# Patient Record
Sex: Female | Born: 2000 | Race: Black or African American | Hispanic: No | Marital: Single | Smoking: Never smoker
Health system: Southern US, Community
[De-identification: ages and names within clinical notes are randomized; demographics above are authoritative.]

## PROBLEM LIST (undated history)

## (undated) DIAGNOSIS — Z789 Other specified health status: Secondary | ICD-10-CM

## (undated) HISTORY — PX: NO PAST SURGERIES: SHX2092

## (undated) HISTORY — DX: Other specified health status: Z78.9

---
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
## 2023-09-18 ENCOUNTER — Ambulatory Visit: Payer: Medicaid Other | Admitting: Obstetrics and Gynecology

## 2023-11-25 ENCOUNTER — Ambulatory Visit: Payer: Medicaid Other | Admitting: Obstetrics and Gynecology

## 2023-11-25 ENCOUNTER — Other Ambulatory Visit (HOSPITAL_COMMUNITY)
Admission: RE | Admit: 2023-11-25 | Discharge: 2023-11-25 | Disposition: A | Discharge status: Home / Self Care | Payer: Medicaid Other | Source: Ambulatory Visit | Attending: Obstetrics and Gynecology | Admitting: Obstetrics and Gynecology

## 2023-11-25 ENCOUNTER — Encounter: Payer: Self-pay | Admitting: Obstetrics and Gynecology

## 2023-11-25 VITALS — BP 110/77 | HR 67 | Ht 61.0 in | Wt 199.0 lb

## 2023-11-25 DIAGNOSIS — Z3200 Encounter for pregnancy test, result unknown: Secondary | ICD-10-CM | POA: Diagnosis not present

## 2023-11-25 DIAGNOSIS — E66812 Obesity, class 2: Secondary | ICD-10-CM

## 2023-11-25 DIAGNOSIS — Z01419 Encounter for gynecological examination (general) (routine) without abnormal findings: Secondary | ICD-10-CM

## 2023-11-25 DIAGNOSIS — E282 Polycystic ovarian syndrome: Secondary | ICD-10-CM

## 2023-11-25 DIAGNOSIS — Z1151 Encounter for screening for human papillomavirus (HPV): Secondary | ICD-10-CM | POA: Diagnosis not present

## 2023-11-25 DIAGNOSIS — N915 Oligomenorrhea, unspecified: Secondary | ICD-10-CM | POA: Diagnosis not present

## 2023-11-25 LAB — POCT URINE PREGNANCY: Preg Test, Ur: NEGATIVE

## 2023-11-25 NOTE — Progress Notes (Signed)
Subjective:     Anita Haynes is a 22 y.o. female P0 with LMP 07/25/23 and BMI 37 is here for a comprehensive physical exam. The patient reports a history of irregular menses since 2019. She states she often has 2-3 period per year. She denies pelvic pain or abnormal discharge. She is sexually active without contraception and desires a pregnancy. She states that her partner is healthy and has had a normal semen analysis with his PCP. Patient is without any other complaints  Past Medical History:  Diagnosis Date   Medical history non-contributory    Past Surgical History:  Procedure Laterality Date   NO PAST SURGERIES     Family History  Problem Relation Age of Onset   Healthy Mother    Social History   Socioeconomic History   Marital status: Single    Spouse name: Not on file   Number of children: 0   Years of education: Not on file   Highest education level: Not on file  Occupational History   Not on file  Tobacco Use   Smoking status: Never   Smokeless tobacco: Never  Vaping Use   Vaping status: Never Used  Substance and Sexual Activity   Alcohol use: Not Currently   Drug use: Never   Sexual activity: Yes    Birth control/protection: None  Other Topics Concern   Not on file  Social History Narrative   Not on file   Social Drivers of Health   Financial Resource Strain: Not on file  Food Insecurity: Not on file  Transportation Needs: Not on file  Physical Activity: Not on file  Stress: Not on file  Social Connections: Not on file  Intimate Partner Violence: Not on file   Health Maintenance  Topic Date Due   HPV VACCINES (1 - 3-dose series) Never done   CHLAMYDIA SCREENING  11/06/2019   DTaP/Tdap/Td (1 - Tdap) Never done   INFLUENZA VACCINE  07/11/2023   COVID-19 Vaccine (1 - 2024-25 season) Never done   Cervical Cancer Screening (Pap smear)  Never done   Hepatitis C Screening  Completed   HIV Screening  Completed       Review of Systems Pertinent  items noted in HPI and remainder of comprehensive ROS otherwise negative.   Objective:  Blood pressure 110/77, pulse 67, height 5\' 1"  (1.549 m), weight 199 lb (90.3 kg), last menstrual period 07/25/2023.   GENERAL: Well-developed, well-nourished female in no acute distress.  HEENT: Normocephalic, atraumatic. Sclerae anicteric.  NECK: Supple. Normal thyroid.  LUNGS: Clear to auscultation bilaterally.  HEART: Regular rate and rhythm. BREASTS: Symmetric in size. No palpable masses or lymphadenopathy, skin changes, or nipple drainage. ABDOMEN: Soft, nontender, nondistended. No organomegaly. PELVIC: Normal external female genitalia. Vagina is pink and rugated.  Normal discharge. Normal appearing cervix. Uterus is normal in size. No adnexal mass or tenderness. Chaperone present during the pelvic exam EXTREMITIES: No cyanosis, clubbing, or edema, 2+ distal pulses.     Assessment:    Healthy female exam.      Plan:    Pap smear collected STI screening collected TSH and labs ordered Patient will be contacted with abnormal results Patient with long history of oligomenorrhea with normal blood work- suspect PCOS.  Patient agreed to nutritionist referral Patient declined contraception for cycle control as she is trying to conceive Discussed the use of ovulation predictor kits while attempting to lose weight Advised to start taking prenatal vitamins See After Visit Summary for Counseling Recommendations

## 2023-11-25 NOTE — Progress Notes (Addendum)
22 y.o. New GYN presents for AEX/PAP/STD screening.  Pt c/o irregular periods, her last period was in August 2024 and before that it came in March.  UPT Negative.

## 2023-11-25 NOTE — Patient Instructions (Signed)

## 2023-11-26 LAB — CERVICOVAGINAL ANCILLARY ONLY
Chlamydia: NEGATIVE
Comment: NEGATIVE
Comment: NORMAL
Neisseria Gonorrhea: NEGATIVE

## 2023-11-26 LAB — CYTOLOGY - PAP
Comment: NEGATIVE
Diagnosis: NEGATIVE
High risk HPV: NEGATIVE

## 2023-11-28 LAB — TSH+PRL+FSH+TESTT+LH+DHEA S...
17-Hydroxyprogesterone: 137 ng/dL
Androstenedione: 240 ng/dL (ref 41–262)
DHEA-SO4: 302 ug/dL (ref 110.0–431.7)
FSH: 6.5 m[IU]/mL
LH: 17.7 m[IU]/mL
Prolactin: 23.7 ng/mL (ref 4.8–33.4)
TSH: 1.95 u[IU]/mL (ref 0.450–4.500)
Testosterone, Free: 3.3 pg/mL (ref 0.0–4.2)
Testosterone: 79 ng/dL — ABNORMAL HIGH (ref 13–71)

## 2023-11-28 LAB — RPR: RPR Ser Ql: NONREACTIVE

## 2023-11-28 LAB — HEMOGLOBIN A1C
Est. average glucose Bld gHb Est-mCnc: 114 mg/dL
Hgb A1c MFr Bld: 5.6 % (ref 4.8–5.6)

## 2023-11-28 LAB — HIV ANTIBODY (ROUTINE TESTING W REFLEX): HIV Screen 4th Generation wRfx: NONREACTIVE

## 2023-11-28 LAB — HEPATITIS B SURFACE ANTIGEN: Hepatitis B Surface Ag: NEGATIVE

## 2023-11-28 LAB — HEPATITIS C ANTIBODY: Hep C Virus Ab: NONREACTIVE

## 2024-01-17 ENCOUNTER — Ambulatory Visit: Payer: Medicaid Other | Admitting: Dietician

## 2024-02-03 ENCOUNTER — Ambulatory Visit (HOSPITAL_COMMUNITY)
Admission: EM | Admit: 2024-02-03 | Discharge: 2024-02-03 | Disposition: A | Discharge status: Home / Self Care | Payer: Medicaid Other | Attending: Emergency Medicine | Admitting: Emergency Medicine

## 2024-02-03 ENCOUNTER — Encounter (HOSPITAL_COMMUNITY): Payer: Self-pay | Admitting: Emergency Medicine

## 2024-02-03 DIAGNOSIS — U071 COVID-19: Secondary | ICD-10-CM | POA: Diagnosis not present

## 2024-02-03 LAB — POC COVID19/FLU A&B COMBO
Covid Antigen, POC: POSITIVE — AB
Influenza A Antigen, POC: NEGATIVE
Influenza B Antigen, POC: NEGATIVE

## 2024-02-03 NOTE — ED Triage Notes (Signed)
 Used interpretor  Pt having back pain yesterday that is now all over body. Pt also c/o fatigue and headache.  Hasn't taken any medications for pain.

## 2024-02-03 NOTE — Discharge Instructions (Addendum)
 You tested positive for COVID-19 today. You can alternate between Tylenol and Ibuprofen as needed for pain and fever. I recommend Mucinex, Delsym, or Robitussin for cough and congestion as needed. Stay hydrated and get plenty of rest. Return here if symptoms persist or worsen.   Wapimishije ibyiza kuri COVID-19 uyumunsi. Anita Haynes guhinduranya hagati ya Tylenol na Ibuprofen nkuko bikenewe kubabara no kugira umuriro. Ndasaba Mucinex, Delsym, cyangwa Robitussin gukorora no kuzura nkuko bikenewe. Gumana amazi kandi uruhuke byinshi. Garuka hano niba ibimenyetso bikomeje cyangwa bikabije.

## 2024-02-03 NOTE — ED Provider Notes (Signed)
 [facility]    CSN: [id number] Arrival date & time: 02/03/24  1316      History   Chief Complaint Chief Complaint  Patient presents with   Generalized Body Aches    HPI Anita Haynes is a 23 y.o. female.   Patient presents with body aches, headache, sore throat, fatigue, and chills that began yesterday per interpreter. Denies fever, cough, congestion, nausea, vomiting, and diarrhea. Denies taking anything for symptoms.      Past Medical History:  Diagnosis Date   Medical history non-contributory     Patient Active Problem List   Diagnosis Date Noted   Language barrier 10/31/2018   High risk sexual behavior in adolescent 10/31/2018   Pain of upper abdomen 10/31/2018   Oligomenorrhea 10/31/2018    Past Surgical History:  Procedure Laterality Date   NO PAST SURGERIES      OB History     Gravida  0   Para  0   Term  0   Preterm  0   AB  0   Living  0      SAB  0   IAB  0   Ectopic  0   Multiple  0   Live Births  0            Home Medications    Prior to Admission medications   Not on File    Family History Family History  Problem Relation Age of Onset   Healthy Mother     Social History Social History   Tobacco Use   Smoking status: Never   Smokeless tobacco: Never  Vaping Use   Vaping status: Never Used  Substance Use Topics   Alcohol use: Not Currently   Drug use: Never     Allergies   Patient has no known allergies.   Review of Systems Review of Systems  Per HPI  Physical Exam Triage Vital Signs ED Triage Vitals  Encounter Vitals Group     BP 02/03/24 1438 115/84     Systolic BP Percentile --      Diastolic BP Percentile --      Pulse Rate 02/03/24 1438 97     Resp 02/03/24 1438 17     Temp 02/03/24 1438 98.4 F (36.9 C)     Temp Source 02/03/24 1438 Oral     SpO2 02/03/24 1438 96 %     Weight --      Height --      Head Circumference --      Peak Flow --      Pain Score 02/03/24  1440 6     Pain Loc --      Pain Education --      Exclude from Growth Chart --    No data found.  Updated Vital Signs BP 115/84 (BP Location: Left Arm)   Pulse 97   Temp 98.4 F (36.9 C) (Oral)   Resp 17   LMP 12/28/2023   SpO2 96%   Visual Acuity Right Eye Distance:   Left Eye Distance:   Bilateral Distance:    Right Eye Near:   Left Eye Near:    Bilateral Near:     Physical Exam Vitals and nursing note reviewed.  Constitutional:      General: She is awake. She is not in acute distress.    Appearance: Normal appearance. She is well-developed and well-groomed. She is not ill-appearing.  HENT:     Right Ear: Tympanic  membrane, ear canal and external ear normal.     Left Ear: Tympanic membrane, ear canal and external ear normal.     Nose: Nose normal.     Mouth/Throat:     Mouth: Mucous membranes are moist.     Pharynx: Posterior oropharyngeal erythema present. No oropharyngeal exudate.  Cardiovascular:     Rate and Rhythm: Normal rate and regular rhythm.  Pulmonary:     Effort: Pulmonary effort is normal.     Breath sounds: Normal breath sounds.  Musculoskeletal:        General: Normal range of motion.  Skin:    General: Skin is warm and dry.  Neurological:     Mental Status: She is alert.  Psychiatric:        Behavior: Behavior is cooperative.      UC Treatments / Results  Labs (all labs ordered are listed, but only abnormal results are displayed) Labs Reviewed  POC COVID19/FLU A&B COMBO - Abnormal; Notable for the following components:      Result Value   Covid Antigen, POC Positive (*)    All other components within normal limits    EKG   Radiology No results found.  Procedures Procedures (including critical care time)  Medications Ordered in UC Medications - No data to display  Initial Impression / Assessment and Plan / UC Course  I have reviewed the triage vital signs and the nursing notes.  Pertinent labs & imaging results that were  available during my care of the patient were reviewed by me and considered in my medical decision making (see chart for details).     Per interpreter, patient presented with body aches, headache, sore throat, fatigue, and chills that began yesterday.   Upon assessment mild erythema noted to pharynx. Lungs clear bilaterally on auscultation. No other significant findings on exam.   Tested positive for COVID. Discussed OTC medication for symptoms. Discussed return precautions.  Final Clinical Impressions(s) / UC Diagnoses   Final diagnoses:  COVID-19     Discharge Instructions      You tested positive for COVID-19 today. You can alternate between Tylenol and Ibuprofen as needed for pain and fever. I recommend Mucinex, Delsym, or Robitussin for cough and congestion as needed. Stay hydrated and get plenty of rest. Return here if symptoms persist or worsen.   Wapimishije ibyiza kuri COVID-19 uyumunsi. Edgar Frisk guhinduranya hagati ya Tylenol na Ibuprofen nkuko bikenewe kubabara no kugira umuriro. Ndasaba Mucinex, Delsym, cyangwa Robitussin gukorora no kuzura nkuko bikenewe. Gumana amazi kandi uruhuke byinshi. Garuka hano niba ibimenyetso bikomeje cyangwa bikabije.     ED Prescriptions   None    PDMP not reviewed this encounter.   Wynonia Lawman A, NP 02/03/24 [phone]

## 2024-02-06 ENCOUNTER — Ambulatory Visit: Payer: Medicaid Other | Admitting: Dietician
